# Patient Record
Sex: Female | Born: 1965 | Race: Black or African American | Hispanic: No | Marital: Single
Health system: Southern US, Community
[De-identification: ages and names within clinical notes are randomized; demographics above are authoritative.]

---

## 2017-03-27 ENCOUNTER — Ambulatory Visit (INDEPENDENT_AMBULATORY_CARE_PROVIDER_SITE_OTHER): Payer: Worker's Compensation

## 2017-03-27 ENCOUNTER — Ambulatory Visit (HOSPITAL_COMMUNITY)
Admission: EM | Admit: 2017-03-27 | Discharge: 2017-03-27 | Disposition: A | Discharge status: Home / Self Care | Payer: Worker's Compensation | Attending: Family Medicine | Admitting: Family Medicine

## 2017-03-27 ENCOUNTER — Encounter (HOSPITAL_COMMUNITY): Payer: Self-pay | Admitting: Family Medicine

## 2017-03-27 DIAGNOSIS — S9031XA Contusion of right foot, initial encounter: Secondary | ICD-10-CM

## 2017-03-27 NOTE — ED Triage Notes (Signed)
Pt here for right foot pain after a pallet fell on her foot on Thursday. Reports that after that someone stepped on it. Pain to the top of foot.

## 2017-03-27 NOTE — Discharge Instructions (Signed)
At this point I think just icing the foot and keeping it elevated will be enough.  You should see gradual resolution over the next week.

## 2017-03-27 NOTE — ED Provider Notes (Addendum)
  [facility]   [id number] 03/27/17 Arrival Time: 1349   SUBJECTIVE:  Kari Barry is a 51 y.o. female who presents to the urgent care with complaint of right foot pain after a pallet fell on her foot on Thursday. Reports that after that someone stepped on it. Pain to the top of foot.      History reviewed. No pertinent past medical history. History reviewed. No pertinent family history. Social History   Socioeconomic History  . Marital status: Single    Spouse name: Not on file  . Number of children: Not on file  . Years of education: Not on file  . Highest education level: Not on file  Social Needs  . Financial resource strain: Not on file  . Food insecurity - worry: Not on file  . Food insecurity - inability: Not on file  . Transportation needs - medical: Not on file  . Transportation needs - non-medical: Not on file  Occupational History  . Not on file  Tobacco Use  . Smoking status: Not on file  Substance and Sexual Activity  . Alcohol use: Not on file  . Drug use: Not on file  . Sexual activity: Not on file  Other Topics Concern  . Not on file  Social History Narrative  . Not on file   No outpatient medications have been marked as taking for the 03/27/17 encounter [facility](Hospital Encounter).   No Known Allergies    ROS: As per HPI, remainder of ROS negative.   OBJECTIVE:   Vitals:   03/27/17 1432  BP: (!) 146/95  Pulse: 74  Resp: 18  Temp: 98 F (36.7 C)  SpO2: 100%     General appearance: alert; no distress Eyes: PERRL; EOMI; conjunctiva normal HENT: normocephalic; atraumatic Neck: supple Back: no CVA tenderness Extremities: no cyanosis or edema; symmetrical with no gross deformities;  Good distal pulse;  Diffuse tenderness over right dorsal foot;  No ecchymosis although foot tattoo covers affected area. Skin: warm and dry Neurologic: antalgic gait; grossly normal Psychological: alert and cooperative; normal mood and  affect      Labs:  No results found for this or any previous visit.  Labs Reviewed - No data to display  Dg Foot Complete Right  Result Date: 03/27/2017 CLINICAL DATA:  Crush injury.  Right foot pain EXAM: RIGHT FOOT COMPLETE - 3+ VIEW COMPARISON:  None. FINDINGS: No acute bony abnormality. Specifically, no fracture, subluxation, or dislocation. Soft tissues are intact. Joint spaces are maintained. IMPRESSION: Negative. Electronically Signed   By: Charlett NoseKevin  Dover M.D.   On: 03/27/2017 15:46       ASSESSMENT & PLAN:  1. Contusion of right foot, initial encounter     No orders of the defined types were placed in this encounter.   Reviewed expectations re: course of current medical issues. Questions answered. Outlined signs and symptoms indicating need for more acute intervention. Patient verbalized understanding. After Visit Summary given.    Procedures:      Elvina SidleLauenstein, Ja Pistole, MD 03/27/17 [id number]    Elvina SidleLauenstein, Mavrik Bynum, MD 03/27/17 [phone]

## 2018-07-19 IMAGING — DX DG FOOT COMPLETE 3+V*R*
3 series · 3 of 3 positions shown · non-contrast
Comparison: None.

CLINICAL DATA: Crush injury.  Right foot pain

EXAM:
RIGHT FOOT COMPLETE - 3+ VIEW

[foot ap]
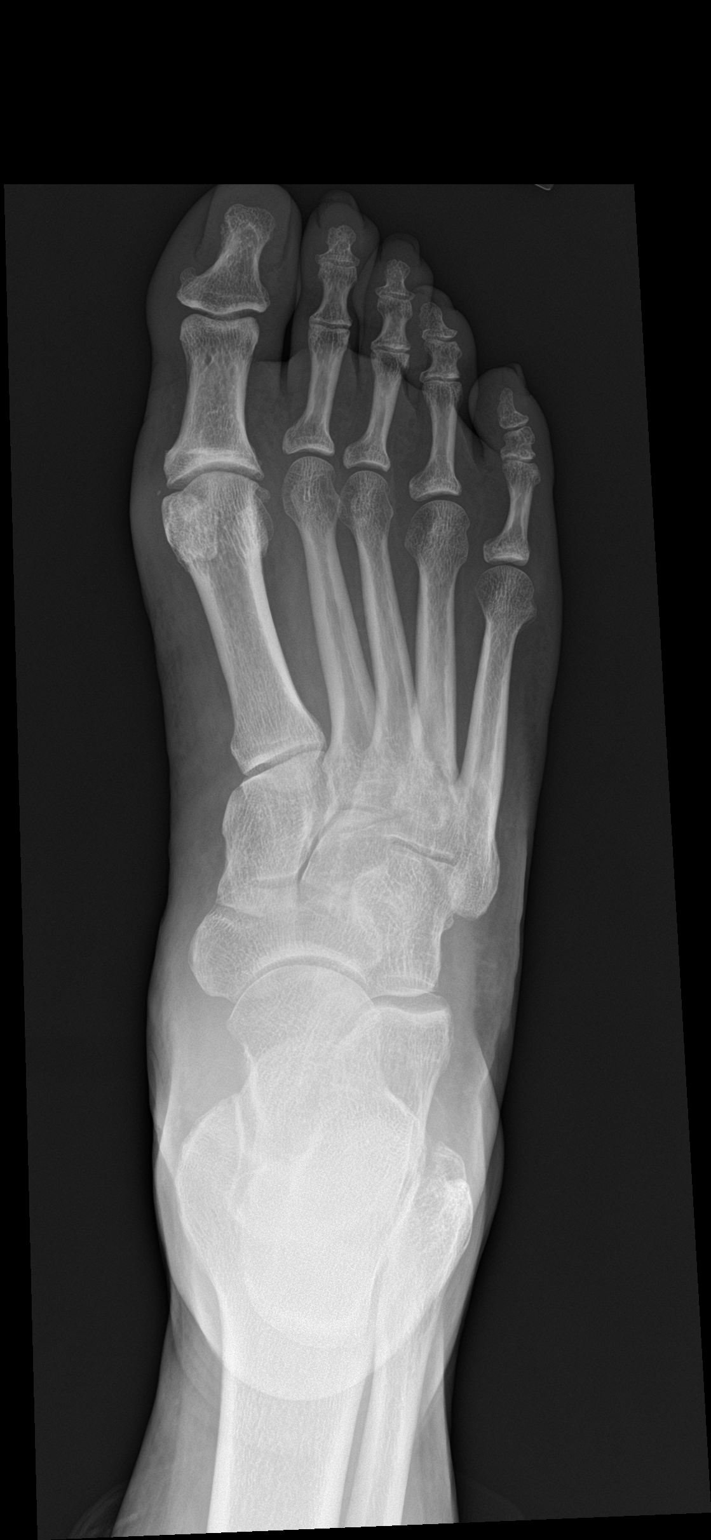

[foot obl]
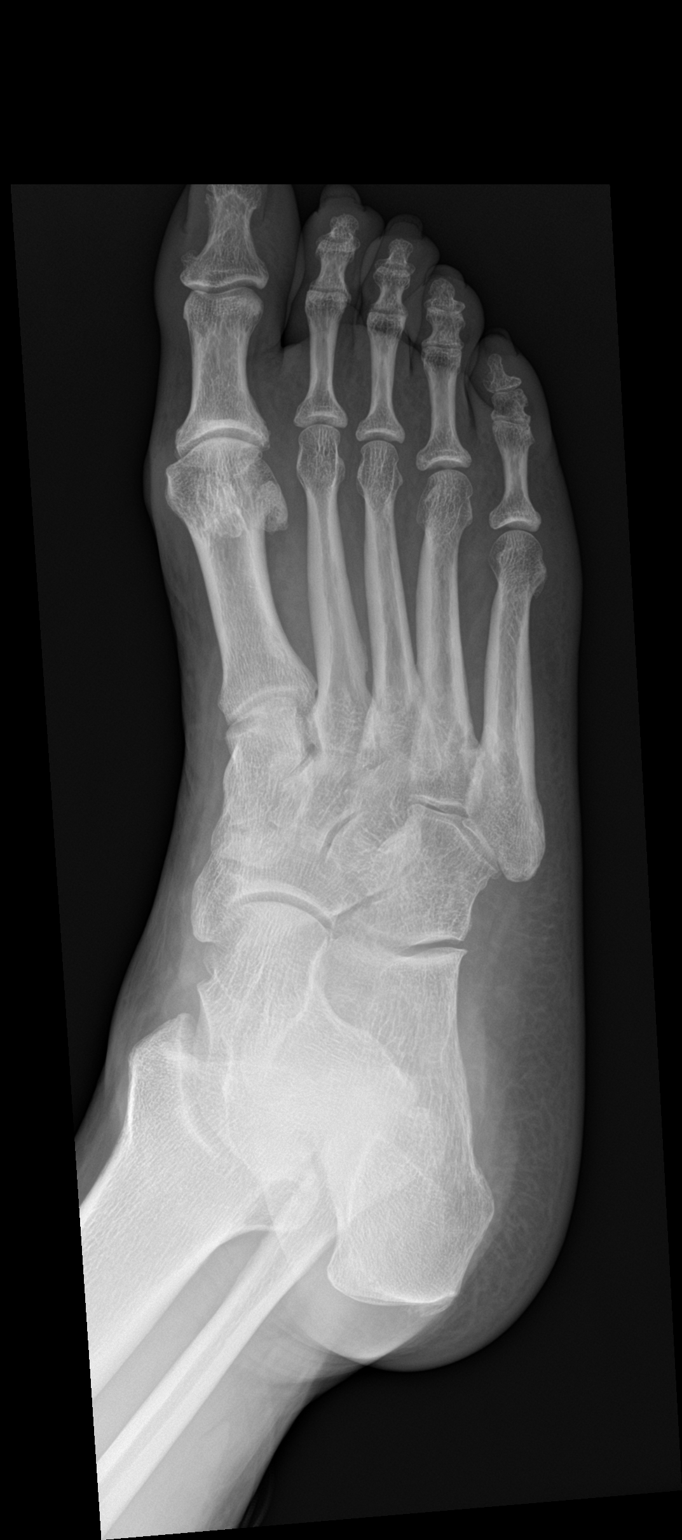

[foot lat]
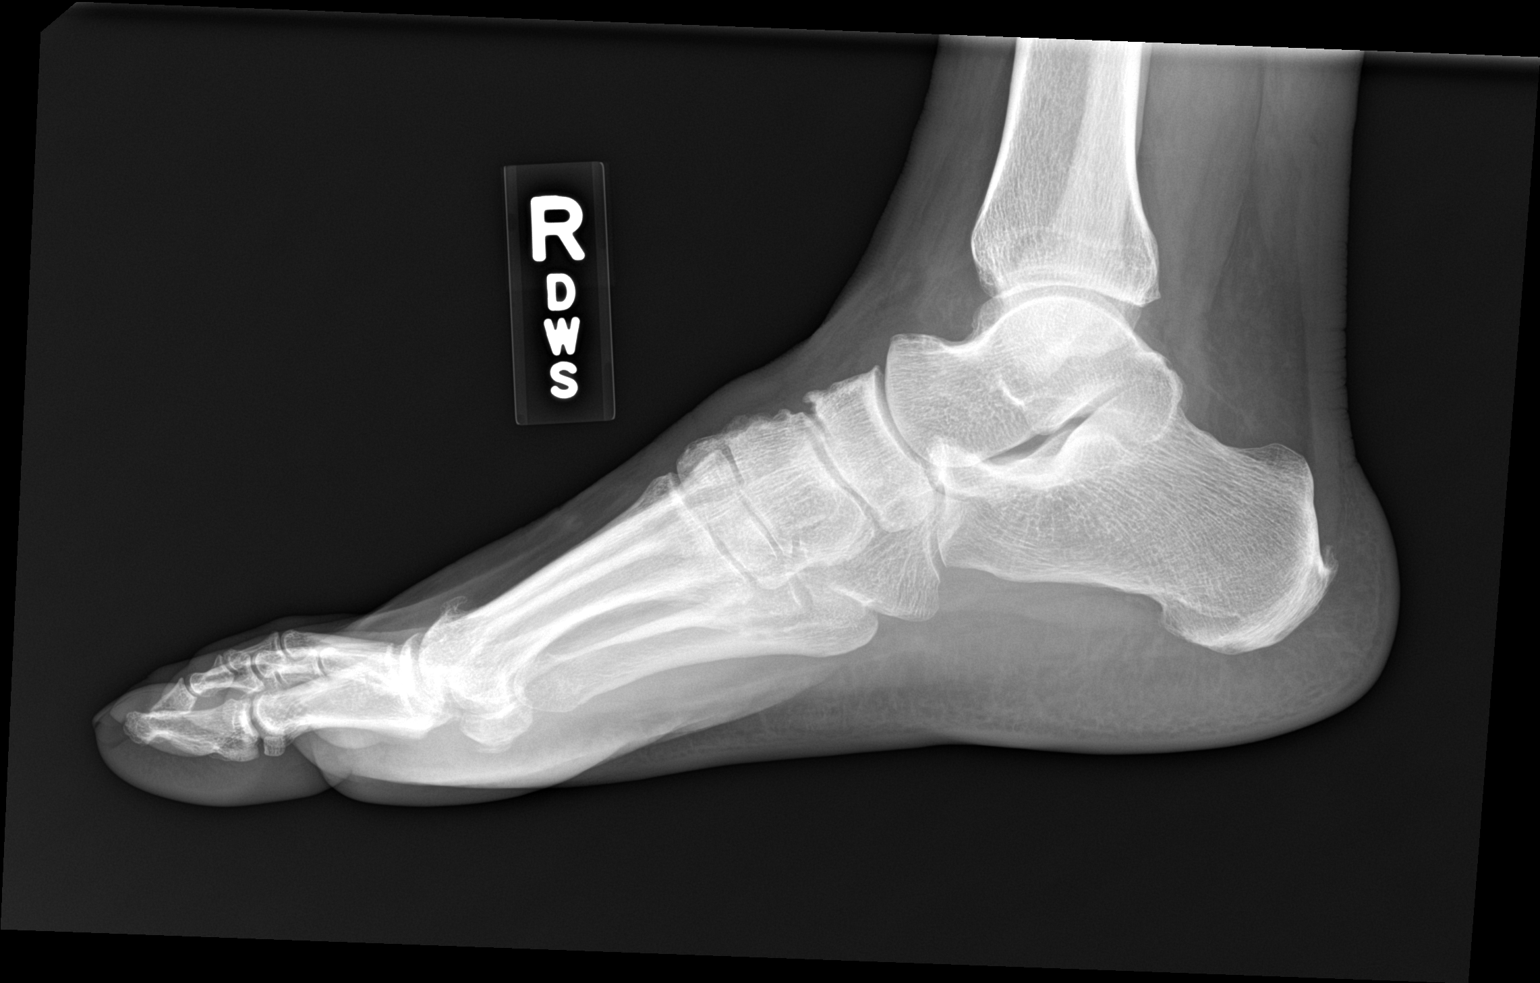

[3 of 3 positions shown; findings below may reference images not displayed]

FINDINGS: No acute bony abnormality. Specifically, no fracture, subluxation,
or dislocation. Soft tissues are intact. Joint spaces are
maintained.
IMPRESSION: Negative.
